# Patient Record
Sex: Male | Born: 1995 | Race: Black or African American | Hispanic: No | Marital: Single | Smoking: Never smoker
Health system: Southern US, Community
[De-identification: ages and names within clinical notes are randomized; demographics above are authoritative.]

---
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
## 2019-07-27 ENCOUNTER — Other Ambulatory Visit: Payer: Self-pay

## 2019-07-27 ENCOUNTER — Encounter (HOSPITAL_COMMUNITY): Payer: Self-pay | Admitting: Emergency Medicine

## 2019-07-27 ENCOUNTER — Emergency Department (HOSPITAL_COMMUNITY)
Admission: EM | Admit: 2019-07-27 | Discharge: 2019-07-27 | Disposition: A | Discharge status: Home / Self Care | Payer: Self-pay | Attending: Emergency Medicine | Admitting: Emergency Medicine

## 2019-07-27 DIAGNOSIS — K6289 Other specified diseases of anus and rectum: Secondary | ICD-10-CM | POA: Insufficient documentation

## 2019-07-27 MED ORDER — DOXYCYCLINE HYCLATE 100 MG PO CAPS: 100.0000 mg | ORAL_CAPSULE | Freq: Two times a day (BID) | ORAL | 0 refills | Status: AC

## 2019-07-27 NOTE — ED Notes (Signed)
CT scan ordered, pt instructed to remain NPO

## 2019-07-27 NOTE — ED Provider Notes (Signed)
[place] DEPT Provider Note   CSN: [id number] Arrival date & time: 07/27/19  1120     History Chief Complaint  Patient presents with  . Hemorrhoids    Hunter Rivera is a 24 y.o. male.  HPI 24 yo male with rectal pain for one week.  STate increases with pushing and feels like he needs to have a bowel movement even though he is having normal stools.  Denies fever, chills, receptive anal intercourse.  Patient eating and drinking with normal intake.     History reviewed. No pertinent past medical history.  There are no problems to display for this patient.   History reviewed. No pertinent surgical history.     Family History  Problem Relation Age of Onset  . Diabetes Other   . Hypertension Other     Social History   Tobacco Use  . Smoking status: Never Smoker  Substance Use Topics  . Alcohol use: No  . Drug use: Not on file    Home Medications Prior to Admission medications   Medication Sig Start Date End Date Taking? Authorizing Provider  acetaminophen (TYLENOL) 500 MG tablet Take 1,000 mg by mouth every 6 (six) hours as needed. Pain    [provider]    Allergies    Patient has no known allergies.  Review of Systems   Review of Systems  All other systems reviewed and are negative.   Physical Exam Updated Vital Signs BP 107/64 (BP Location: Left Arm)   Pulse 84   Temp 98.1 F (36.7 C) (Oral)   Resp 16   Ht 1.778 m (5\' 10" )   Wt 68 kg   SpO2 100%   BMI 21.52 kg/m   Physical Exam Vitals and nursing note reviewed. Exam conducted with a chaperone present.  Constitutional:      Appearance: Normal appearance.  HENT:     Head: Normocephalic.     Right Ear: External ear normal.     Left Ear: External ear normal.     Nose: Nose normal.     Mouth/Throat:     Mouth: Mucous membranes are moist.  Eyes:     Pupils: Pupils are equal, round, and reactive to light.  Cardiovascular:     Rate and Rhythm: Normal  rate and regular rhythm.  Pulmonary:     Effort: Pulmonary effort is normal.     Breath sounds: Normal breath sounds.  Abdominal:     General: Abdomen is flat.     Palpations: Abdomen is soft.  Genitourinary:    Rectum: Normal.     Comments: Patient difficulty to examine.  No swelling noted, no point ttp or internal swelling noted. Musculoskeletal:        General: Normal range of motion.     Cervical back: Normal range of motion.  Skin:    General: Skin is warm.     Capillary Refill: Capillary refill takes less than 2 seconds.  Neurological:     General: No focal deficit present.     Mental Status: He is alert.  Psychiatric:        Mood and Affect: Mood normal.     ED Results / Procedures / Treatments   Labs (all labs ordered are listed, but only abnormal results are displayed) Labs Reviewed - No data to display  EKG None  Radiology No results found.  Procedures Procedures (including critical care time)  Medications Ordered in ED Medications - No data to display  ED Course  I have reviewed the triage vital signs and the nursing notes.  Pertinent labs & imaging results that were available during my care of the patient were reviewed by me and considered in my medical decision making (see chart for details). Exam and history c.w. proctitis Plan abx   MDM Rules/Calculators/A&P                     Final Clinical Impression(s) / ED Diagnoses Final diagnoses:  Proctitis    Rx / DC Orders ED Discharge Orders    None       Margarita Grizzle, MD 07/27/19 1441

## 2019-07-27 NOTE — ED Notes (Signed)
Rectal exam performed by EDP with RN at side

## 2019-07-27 NOTE — ED Triage Notes (Signed)
Per pt, states he has a hemorrhoid, noticed it about 1 week ago-OTC meds have not been helping

## 2019-07-27 NOTE — ED Notes (Signed)
ED Provider at bedside. 

## 2019-07-27 NOTE — ED Notes (Signed)
Presents today w/ pain, inside rectum especially during walking, and coughing. Onset approx 1 week ago. States has not seen any blood in stool or toilet paper, only has pain with BM

## 2019-07-27 NOTE — ED Notes (Signed)
DC instructions reviewed with pt, opportunity for questions provided. Instructed to pick up Rx by EDP.

## 2020-02-16 ENCOUNTER — Other Ambulatory Visit: Payer: Self-pay

## 2020-02-16 ENCOUNTER — Encounter (HOSPITAL_COMMUNITY): Payer: Self-pay | Admitting: Emergency Medicine

## 2020-02-16 ENCOUNTER — Emergency Department (HOSPITAL_COMMUNITY)
Admission: EM | Admit: 2020-02-16 | Discharge: 2020-02-16 | Disposition: A | Discharge status: Home / Self Care | Payer: Medicaid Other | Attending: Emergency Medicine | Admitting: Emergency Medicine

## 2020-02-16 DIAGNOSIS — R112 Nausea with vomiting, unspecified: Secondary | ICD-10-CM | POA: Insufficient documentation

## 2020-02-16 DIAGNOSIS — R197 Diarrhea, unspecified: Secondary | ICD-10-CM | POA: Insufficient documentation

## 2020-02-16 DIAGNOSIS — Z5321 Procedure and treatment not carried out due to patient leaving prior to being seen by health care provider: Secondary | ICD-10-CM | POA: Insufficient documentation

## 2020-02-16 DIAGNOSIS — R109 Unspecified abdominal pain: Secondary | ICD-10-CM | POA: Insufficient documentation

## 2020-02-16 LAB — COMPREHENSIVE METABOLIC PANEL
ALT: 34 U/L (ref 0–44)
AST: 30 U/L (ref 15–41)
Albumin: 4.6 g/dL (ref 3.5–5.0)
Alkaline Phosphatase: 41 U/L (ref 38–126)
Anion gap: 10 (ref 5–15)
BUN: 16 mg/dL (ref 6–20)
CO2: 25 mmol/L (ref 22–32)
Calcium: 9.4 mg/dL (ref 8.9–10.3)
Chloride: 104 mmol/L (ref 98–111)
Creatinine, Ser: 1.13 mg/dL (ref 0.61–1.24)
GFR calc Af Amer: >60 mL/min (ref >60)
GFR calc non Af Amer: >60 mL/min (ref >60)
Glucose, Bld: 90 mg/dL (ref 70–99)
Potassium: 4.3 mmol/L (ref 3.5–5.1)
Sodium: 139 mmol/L (ref 135–145)
Total Bilirubin: 1 mg/dL (ref 0.3–1.2)
Total Protein: 8 g/dL (ref 6.5–8.1)

## 2020-02-16 LAB — CBC
HCT: 42.8 % (ref 39.0–52.0)
Hemoglobin: 13.3 g/dL (ref 13.0–17.0)
MCH: 24.5 pg — ABNORMAL LOW (ref 26.0–34.0)
MCHC: 31.1 g/dL (ref 30.0–36.0)
MCV: 78.8 fL — ABNORMAL LOW (ref 80.0–100.0)
Platelets: 270 10*3/uL (ref 150–400)
RBC: 5.43 MIL/uL (ref 4.22–5.81)
RDW: 14.5 % (ref 11.5–15.5)
WBC: 5.9 10*3/uL (ref 4.0–10.5)
nRBC: 0 % (ref 0.0–0.2)

## 2020-02-16 LAB — LIPASE, BLOOD: Lipase: 29 U/L (ref 11–51)

## 2020-02-16 MED ORDER — SODIUM CHLORIDE 0.9% FLUSH: 3.0000 mL | Freq: Once | INTRAVENOUS | Status: DC

## 2020-02-16 NOTE — ED Triage Notes (Signed)
Pt complaint of abd pain with n/v/d for months; "was given doxycycline a while back but I just threw it up."

## 2020-02-16 NOTE — ED Notes (Signed)
Pt told registration that he was leaving and left his patient stickers. °

## 2020-02-16 NOTE — ED Notes (Signed)
Pt eloped from waiting area. Called 3X.
